# Patient Record
Sex: Male | Born: 1985 | Race: Black or African American | Hispanic: No | Marital: Married | Smoking: Never smoker
Health system: Southern US, Community
[De-identification: ages and names within clinical notes are randomized; demographics above are authoritative.]

---

## 2013-12-04 ENCOUNTER — Emergency Department (INDEPENDENT_AMBULATORY_CARE_PROVIDER_SITE_OTHER)
Admission: EM | Admit: 2013-12-04 | Discharge: 2013-12-04 | Disposition: A | Discharge status: Home / Self Care | Payer: Managed Care, Other (non HMO) | Source: Home / Self Care | Attending: Emergency Medicine | Admitting: Emergency Medicine

## 2013-12-04 ENCOUNTER — Encounter (HOSPITAL_COMMUNITY): Payer: Self-pay | Admitting: Emergency Medicine

## 2013-12-04 DIAGNOSIS — A084 Viral intestinal infection, unspecified: Secondary | ICD-10-CM

## 2013-12-04 DIAGNOSIS — A088 Other specified intestinal infections: Secondary | ICD-10-CM

## 2013-12-04 LAB — POCT I-STAT, CHEM 8
BUN: 12 mg/dL (ref 6–23)
CREATININE: 1.3 mg/dL (ref 0.50–1.35)
Calcium, Ion: 1.2 mmol/L (ref 1.12–1.23)
Chloride: 102 mEq/L (ref 96–112)
Glucose, Bld: 106 mg/dL — ABNORMAL HIGH (ref 70–99)
HEMATOCRIT: 52 % (ref 39.0–52.0)
Hemoglobin: 17.7 g/dL — ABNORMAL HIGH (ref 13.0–17.0)
POTASSIUM: 4.4 meq/L (ref 3.7–5.3)
SODIUM: 139 meq/L (ref 137–147)
TCO2: 25 mmol/L (ref 0–100)

## 2013-12-04 LAB — CBC WITH DIFFERENTIAL/PLATELET
BASOS ABS: 0 10*3/uL (ref 0.0–0.1)
Basophils Relative: 0 % (ref 0–1)
Eosinophils Absolute: 0 10*3/uL (ref 0.0–0.7)
Eosinophils Relative: 0 % (ref 0–5)
HCT: 47.1 % (ref 39.0–52.0)
Hemoglobin: 15.6 g/dL (ref 13.0–17.0)
LYMPHS ABS: 0.5 10*3/uL — AB (ref 0.7–4.0)
Lymphocytes Relative: 5 % — ABNORMAL LOW (ref 12–46)
MCH: 28.8 pg (ref 26.0–34.0)
MCHC: 33.1 g/dL (ref 30.0–36.0)
MCV: 87.1 fL (ref 78.0–100.0)
Monocytes Absolute: 0.4 10*3/uL (ref 0.1–1.0)
Monocytes Relative: 4 % (ref 3–12)
NEUTROS PCT: 91 % — AB (ref 43–77)
Neutro Abs: 10.5 10*3/uL — ABNORMAL HIGH (ref 1.7–7.7)
Platelets: 315 10*3/uL (ref 150–400)
RBC: 5.41 MIL/uL (ref 4.22–5.81)
RDW: 13 % (ref 11.5–15.5)
WBC: 11.5 10*3/uL — AB (ref 4.0–10.5)

## 2013-12-04 MED ORDER — SODIUM CHLORIDE 0.9 % IV SOLN
INTRAVENOUS | Status: DC
  Administered 2013-12-04: 10:00:00 via INTRAVENOUS

## 2013-12-04 MED ORDER — ONDANSETRON 4 MG PO TBDP
8.0000 mg | ORAL_TABLET | Freq: Once | ORAL | Status: AC
  Administered 2013-12-04: 8 mg via ORAL

## 2013-12-04 MED ORDER — DIPHENOXYLATE-ATROPINE 2.5-0.025 MG PO TABS: 1.0000 | ORAL_TABLET | Freq: Four times a day (QID) | ORAL | Status: DC | PRN

## 2013-12-04 MED ORDER — ONDANSETRON 4 MG PO TBDP
ORAL_TABLET | ORAL | Status: AC
  Filled 2013-12-04: qty 1

## 2013-12-04 MED ORDER — ONDANSETRON 8 MG PO TBDP: 8.0000 mg | ORAL_TABLET | Freq: Three times a day (TID) | ORAL | Status: DC | PRN

## 2013-12-04 NOTE — Discharge Instructions (Signed)

## 2013-12-04 NOTE — ED Provider Notes (Signed)
Chief Complaint   Chief Complaint  Patient presents with  . Abdominal Pain     History of Present Illness   Jonathan Contreras is a 28 year old male who has had a history since last night of vomiting about five times. There was no blood in the vomitus, or coffee ground emesis, but the vomitus was somewhat green in color. He also has had diarrhea 9 to 12 times. Stools have been green in color. He tried some Pepto-Bismol but threw this back up. Symptoms came on after eating a meatball sub while at work. He has also had sweats, subjective fever, and chills. He's had pain in his upper abdomen around the epigastrium without radiation to the back or lower abdominal pain. He feels dizzy and weak. He has had no sick exposures or foreign travel.  Review of Systems   Other than as noted above, the patient denies any of the following symptoms: Systemic:  No fevers, chills, or dizziness. ENT:  No nasal congestion, rhinorrhea, or sore throat. Lungs:  No cough. GI:  Blood in stool or vomitus. GU:  No dysuria, frequency, or urgency.  PMFSH   Past medical history, family history, social history, meds, and allergies were reviewed.    Physical Exam     Vital signs:  BP 127/87  Pulse 99  Temp(Src) 98.1 F (36.7 C) (Oral)  Resp 12  SpO2 98% Filed Vitals:   12/04/13 0832 12/04/13 0915 Supine 12/04/13 0916 Sitting 12/04/13 0917 Standing  BP: 149/70 124/88 115/74 127/87  Pulse: 96 60 86 99  Temp: 98.1 F (36.7 C)     TempSrc: Oral     Resp: 12     SpO2: 98%      General:  Alert and oriented.  In no distress.  Skin warm and dry.  Good skin turgor, brisk capillary refill. ENT:  No scleral icterus, moist mucous membranes, no oral lesions, pharynx clear. Lungs:  Breath sounds clear and equal bilaterally.  No wheezes, rales, or rhonchi. Heart:  Rhythm regular, without extrasystoles.  No gallops or murmers. Abdomen:  Abdomen was soft, flat, nondistended. There was no organomegaly or or mass. Bowel  sounds are hyperactive. There is tenderness to palpation in the epigastrium, but also over the entire abdomen without guarding a rebound. Skin: Clear, warm, and dry.  Good turgor.  Brisk capillary refill.  Labs   Results for orders placed during the hospital encounter of 12/04/13  CBC WITH DIFFERENTIAL      Result Value Ref Range   WBC 11.5 (*) 4.0 - 10.5 K/uL   RBC 5.41  4.22 - 5.81 MIL/uL   Hemoglobin 15.6  13.0 - 17.0 g/dL   HCT 81.147.1  91.439.0 - 78.252.0 %   MCV 87.1  78.0 - 100.0 fL   MCH 28.8  26.0 - 34.0 pg   MCHC 33.1  30.0 - 36.0 g/dL   RDW 95.613.0  21.311.5 - 08.615.5 %   Platelets 315  150 - 400 K/uL   Neutrophils Relative % 91 (*) 43 - 77 %   Neutro Abs 10.5 (*) 1.7 - 7.7 K/uL   Lymphocytes Relative 5 (*) 12 - 46 %   Lymphs Abs 0.5 (*) 0.7 - 4.0 K/uL   Monocytes Relative 4  3 - 12 %   Monocytes Absolute 0.4  0.1 - 1.0 K/uL   Eosinophils Relative 0  0 - 5 %   Eosinophils Absolute 0.0  0.0 - 0.7 K/uL   Basophils Relative 0  0 -  1 %   Basophils Absolute 0.0  0.0 - 0.1 K/uL  POCT I-STAT, CHEM 8      Result Value Ref Range   Sodium 139  137 - 147 mEq/L   Potassium 4.4  3.7 - 5.3 mEq/L   Chloride 102  96 - 112 mEq/L   BUN 12  6 - 23 mg/dL   Creatinine, Ser 4.691.30  0.50 - 1.35 mg/dL   Glucose, Bld 629106 (*) 70 - 99 mg/dL   Calcium, Ion 5.281.20  1.12 - 1.23 mmol/L   TCO2 25  0 - 100 mmol/L   Hemoglobin 17.7 (*) 13.0 - 17.0 g/dL   HCT 41.352.0  24.439.0 - 01.052.0 %     Course in Urgent Care Center   He was given Zofran ODT if milligrams sublingually and one leader of IV normal saline. Thereafter he felt a lot better.   Assessment   The encounter diagnosis was Viral gastroenteritis.  With mild dehydration.  Plan   1.  Meds:  The following meds were prescribed:   Discharge Medication List as of 12/04/2013 10:11 AM    START taking these medications   Details  diphenoxylate-atropine (LOMOTIL) 2.5-0.025 MG per tablet Take 1 tablet by mouth 4 (four) times daily as needed for diarrhea or loose  stools., Starting 12/04/2013, Until Discontinued, Print    ondansetron (ZOFRAN ODT) 8 MG disintegrating tablet Take 1 tablet (8 mg total) by mouth every 8 (eight) hours as needed for nausea., Starting 12/04/2013, Until Discontinued, Normal        2.  Patient Education/Counseling:  The patient was given appropriate handouts, self care instructions, and instructed in symptomatic relief. The patient was told to stay on clear liquids for the remainder of the day, then advance to a B.R.A.T. diet starting tomorrow.   3.  Follow up:  The patient was told to follow up here if no better in 2 to 3 days, or sooner if becoming worse in any way, and given some red flag symptoms such as persistent vomitng, high fever, severe abdominal pain, or any GI bleeding which would prompt immediate return.         Reuben Likesavid C Earlisha Sharples, MD 12/04/13 [phone]

## 2013-12-04 NOTE — ED Notes (Signed)
C/o sick since last PM, nausea, vomiting, epigastric pain. No one else at home is ill. NAD

## 2015-05-10 ENCOUNTER — Encounter (HOSPITAL_COMMUNITY): Payer: Self-pay | Admitting: Emergency Medicine

## 2015-05-10 ENCOUNTER — Emergency Department (HOSPITAL_COMMUNITY)
Admission: EM | Admit: 2015-05-10 | Discharge: 2015-05-10 | Disposition: A | Discharge status: Home / Self Care | Payer: Managed Care, Other (non HMO) | Attending: Emergency Medicine | Admitting: Emergency Medicine

## 2015-05-10 ENCOUNTER — Emergency Department (HOSPITAL_COMMUNITY): Payer: Managed Care, Other (non HMO)

## 2015-05-10 DIAGNOSIS — Y998 Other external cause status: Secondary | ICD-10-CM | POA: Insufficient documentation

## 2015-05-10 DIAGNOSIS — Z23 Encounter for immunization: Secondary | ICD-10-CM | POA: Insufficient documentation

## 2015-05-10 DIAGNOSIS — S8001XA Contusion of right knee, initial encounter: Secondary | ICD-10-CM | POA: Insufficient documentation

## 2015-05-10 DIAGNOSIS — S8991XA Unspecified injury of right lower leg, initial encounter: Secondary | ICD-10-CM | POA: Diagnosis present

## 2015-05-10 DIAGNOSIS — W208XXA Other cause of strike by thrown, projected or falling object, initial encounter: Secondary | ICD-10-CM | POA: Insufficient documentation

## 2015-05-10 DIAGNOSIS — S60212A Contusion of left wrist, initial encounter: Secondary | ICD-10-CM | POA: Diagnosis not present

## 2015-05-10 DIAGNOSIS — Y9289 Other specified places as the place of occurrence of the external cause: Secondary | ICD-10-CM | POA: Insufficient documentation

## 2015-05-10 DIAGNOSIS — Y9389 Activity, other specified: Secondary | ICD-10-CM | POA: Diagnosis not present

## 2015-05-10 DIAGNOSIS — T148XXA Other injury of unspecified body region, initial encounter: Secondary | ICD-10-CM

## 2015-05-10 MED ORDER — HYDROCODONE-ACETAMINOPHEN 5-325 MG PO TABS
1.0000 | ORAL_TABLET | Freq: Once | ORAL | Status: AC
  Administered 2015-05-10: 1 via ORAL
  Filled 2015-05-10: qty 1

## 2015-05-10 MED ORDER — HYDROCODONE-ACETAMINOPHEN 5-325 MG PO TABS: ORAL_TABLET | ORAL | Status: DC

## 2015-05-10 MED ORDER — TETANUS-DIPHTH-ACELL PERTUSSIS 5-2.5-18.5 LF-MCG/0.5 IM SUSP
0.5000 mL | Freq: Once | INTRAMUSCULAR | Status: AC
  Administered 2015-05-10: 0.5 mL via INTRAMUSCULAR
  Filled 2015-05-10: qty 0.5

## 2015-05-10 NOTE — ED Provider Notes (Signed)
CSN: [id number]     Arrival date & time 05/10/15  2029 History  By signing my name below, I, Jonathan Contreras, attest that this documentation has been prepared under the direction and in the presence of [employer], PA-C. Electronically Signed: Placido Contreras, ED Scribe. 05/10/2015. 9:12 PM.   Chief Complaint  Patient presents with  . Knee Injury   The history is provided by the patient. No language interpreter was used.    HPI Comments: Jonathan Contreras is a 29 y.o. male who presents to the Emergency Department complaining of constant, moderate, right knee pain with onset this evening. Pt notes that he dropped a barbell on the affected leg with over 400 lbs on the bar. He notes a radiation of his pain to his right thigh, rates the pain as 8/10 and further notes associated numbness to his right thigh. He also notes an associated small superficial abrasion to his left wrist with controlled bleeding. He denies taking any medications for pain management PTA. Pt denies driving himself to the ED. He is unsure of his last tdap. Pt denies any other associated symptoms.   History reviewed. No pertinent past medical history. History reviewed. No pertinent past surgical history. No family history on file. Social History  Substance Use Topics  . Smoking status: Never Smoker   . Smokeless tobacco: None  . Alcohol Use: Yes    Review of Systems A complete 10 system review of systems was obtained and all systems are negative except as noted in the HPI and PMH.   Allergies  Review of patient's allergies indicates no known allergies.  Home Medications   Prior to Admission medications   Medication Sig Start Date End Date Taking? Authorizing Provider  diphenoxylate-atropine (LOMOTIL) 2.5-0.025 MG per tablet Take 1 tablet by mouth 4 (four) times daily as needed for diarrhea or loose stools. 12/04/13   [name], MD  ondansetron (ZOFRAN ODT) 8 MG disintegrating tablet Take 1 tablet (8 mg total)  by mouth every 8 (eight) hours as needed for nausea. 12/04/13   [name], MD   BP 152/79 mmHg  Pulse 76  Temp(Src) 98.3 F (36.8 C) (Oral)  Resp 20  SpO2 100% Physical Exam  Constitutional: He is oriented to person, place, and time. He appears well-developed and well-nourished.  HENT:  Head: Normocephalic and atraumatic.  Mouth/Throat: No oropharyngeal exudate.  Neck: Normal range of motion. No tracheal deviation present.  Cardiovascular: Normal rate.   Pulmonary/Chest: Effort normal. No respiratory distress.  Abdominal: Soft. There is no tenderness.  Musculoskeletal: Normal range of motion. He exhibits edema and tenderness.  Right knee with contusion just proximal to the patella. Patient has full range of motion, he is distally neurovascularly intact, patella is properly located. knee is stable to anterior and posterior drawer. No abnormal laxity on valgus and varus stress.  Distally neurovascularly intact. Patient's left radial forearm on the dorsum has a contusion with a partial thickness abrasion is approximately 2 cm. Radial pulses 2+, there is no snuffbox tenderness. Patient has excellent range of motion to fingers, Refill is brisk, distal sensation is intact to pinprick and light touch.  Neurological: He is alert and oriented to person, place, and time.  Skin: Skin is warm and dry. He is not diaphoretic.  Psychiatric: He has a normal mood and affect. His behavior is normal.  Nursing note and vitals reviewed.  ED Course  Procedures  DIAGNOSTIC STUDIES: Oxygen Saturation is 100% on RA, normal by my  interpretation.    COORDINATION OF CARE: 9:11 PM Discussed treatment plan with pt at bedside including an x-ray of the affected knee, a tdap booster and 1x Vicodin for pain management. Pt agreed to plan.  Labs Review Labs Reviewed - No data to display  Imaging Review Dg Wrist Complete Left  05/10/2015   CLINICAL DATA:  400 pound barbell dropped on patient, with left lateral  wrist and distal forearm pain. Initial encounter.  EXAM: LEFT WRIST - COMPLETE 3+ VIEW  COMPARISON:  None.  FINDINGS: There is no evidence of fracture or dislocation. The carpal rows are intact, and demonstrate normal alignment. The joint spaces are preserved.  No significant soft tissue abnormalities are seen.  IMPRESSION: No evidence of fracture or dislocation.   Electronically Signed   By: [name] M.D.   On: 05/10/2015 21:59   Dg Knee Complete 4 Views Right  05/10/2015   CLINICAL DATA:  Acute right knee pain following barbell dropped on knee. Initial encounter.  EXAM: RIGHT KNEE - COMPLETE 4+ VIEW  COMPARISON:  None.  FINDINGS: There is no evidence of acute fracture, subluxation or dislocation.  Suprapatellar soft tissue swelling is noted.  There is no evidence of joint effusion.  No focal bony lesions are present.  IMPRESSION: Soft tissue swelling without bony abnormality.   Electronically Signed   By: [name] M.D.   On: 05/10/2015 21:59   I have personally reviewed and evaluated these images as part of my medical decision-making.   EKG Interpretation None      MDM   Final diagnoses:  Knee contusion, right, initial encounter  Wrist contusion, left, initial encounter  Abrasion    Filed Vitals:   05/10/15 2044  BP: 152/79  Pulse: 76  Temp: 98.3 F (36.8 C)  TempSrc: Oral  Resp: 20  SpO2: 100%    Medications  Tdap (BOOSTRIX) injection 0.5 mL (0.5 mLs Intramuscular Given 05/10/15 2119)  HYDROcodone-acetaminophen (NORCO/VICODIN) 5-325 MG per tablet 1 tablet (1 tablet Oral Given 05/10/15 2119)    [name] is a pleasant 29 y.o. male presenting with contusion to right knee and left distal forearm. Patient also has a small partial-thickness abrasion. Excellent range of motion and patient is nerouvascularly intact. X-rays are negative. Patient will be given crutches, knee sleeve and advised rest, ice, compression elevation.  Evaluation does not show pathology that  would require ongoing emergent intervention or inpatient treatment. Pt is hemodynamically stable and mentating appropriately. Discussed findings and plan with patient/guardian, who agrees with care plan. All questions answered. Return precautions discussed and outpatient follow up given.   New Prescriptions   HYDROCODONE-ACETAMINOPHEN (NORCO/VICODIN) 5-325 MG TABLET    Take 1-2 tablets by mouth every 6 hours as needed for pain and/or cough.     I personally performed the services described in this documentation, which was scribed in my presence. The recorded information has been reviewed and is accurate.   Wynetta Emery, PA-C 05/10/15 2246  Tilden Fossa, MD 05/11/15 [phone]

## 2015-05-10 NOTE — ED Notes (Signed)
Pt. accidentally dropped a heavy barbell at right knee this evening , reports pain at right knee radiating to right thigh and abrasion at left wrist .

## 2015-05-10 NOTE — Discharge Instructions (Signed)
Rest, Ice intermittently (in the first 24-48 hours), Gentle compression with an Ace wrap, and elevate (Limb above the level of the heart)   Take up to  of ibuprofen (that is usually 4 over the counter pills)  3 times a day for 5 days. Take with food.  Take vicodin for breakthrough pain, do not drink alcohol, drive, care for children or do other critical tasks while taking vicodin.  Do not hesitate to return to the emergency room for any new, worsening or concerning symptoms.  Please obtain primary care using resource guide below. Let them know that you were seen in the emergency room and that they will need to obtain records for further outpatient management.   Contusion A contusion is a deep bruise. Contusions are the result of an injury that caused bleeding under the skin. The contusion may turn blue, purple, or yellow. Minor injuries will give you a painless contusion, but more severe contusions may stay painful and swollen for a few weeks.  CAUSES  A contusion is usually caused by a blow, trauma, or direct force to an area of the body. SYMPTOMS   Swelling and redness of the injured area.  Bruising of the injured area.  Tenderness and soreness of the injured area.  Pain. DIAGNOSIS  The diagnosis can be made by taking a history and physical exam. An X-ray, CT scan, or MRI may be needed to determine if there were any associated injuries, such as fractures. TREATMENT  Specific treatment will depend on what area of the body was injured. In general, the best treatment for a contusion is resting, icing, elevating, and applying cold compresses to the injured area. Over-the-counter medicines may also be recommended for pain control. Ask your caregiver what the best treatment is for your contusion. HOME CARE INSTRUCTIONS   Put ice on the injured area.  Put ice in a plastic bag.  Place a towel between your skin and the bag.  Leave the ice on for 15-20 minutes, 3-4 times a day, or as  directed by your health care provider.  Only take over-the-counter or prescription medicines for pain, discomfort, or fever as directed by your caregiver. Your caregiver may recommend avoiding anti-inflammatory medicines (aspirin, ibuprofen, and naproxen) for 48 hours because these medicines may increase bruising.  Rest the injured area.  If possible, elevate the injured area to reduce swelling. SEEK IMMEDIATE MEDICAL CARE IF:   You have increased bruising or swelling.  You have pain that is getting worse.  Your swelling or pain is not relieved with medicines. MAKE SURE YOU:   Understand these instructions.  Will watch your condition.  Will get help right away if you are not doing well or get worse. Document Released: 05/06/2005 Document Revised: 08/01/2013 Document Reviewed: 06/01/2011 [facility] Patient Information 2015 [place], [place]. This information is not intended to replace advice given to you by your health care provider. Make sure you discuss any questions you have with your health care provider.   Emergency Department Resource Guide 1) Find a Doctor and Pay Out of Pocket Although you won't have to find out who is covered by your insurance plan, it is a good idea to ask around and get recommendations. You will then need to call the office and see if the doctor you have chosen will accept you as a new patient and what types of options they offer for patients who are self-pay. Some doctors offer discounts or will set up payment plans for their patients who  do not have insurance, but you will need to ask so you aren't surprised when you get to your appointment.  2) Contact Your Local Health Department Not all health departments have doctors that can see patients for sick visits, but many do, so it is worth a call to see if yours does. If you don't know where your local health department is, you can check in your phone book. The CDC also has a tool to help you locate your state's  health department, and many state websites also have listings of all of their local health departments.  3) Find a Walk-in Clinic If your illness is not likely to be very severe or complicated, you may want to try a walk in clinic. These are popping up all over the country in pharmacies, drugstores, and shopping centers. They're usually staffed by nurse practitioners or physician assistants that have been trained to treat common illnesses and complaints. They're usually fairly quick and inexpensive. However, if you have serious medical issues or chronic medical problems, these are probably not your best option.  No Primary Care Doctor: - Call Health Connect at  [phone] - they can help you locate a primary care doctor that  accepts your insurance, provides certain services, etc. - Physician Referral Service- [phone]  Chronic Pain Problems: Organization         Address  Phone   Notes  [facility]  [phone] Patients need to be referred by their primary care doctor.   Medication Assistance: Organization         Address  Phone   Notes  [facility] Medication [facility] [address] [place] [place], [place] [address] [phone] --Must be a resident of [facility] -- Must have NO insurance coverage whatsoever (no Medicaid/ Medicare, etc.) -- The pt. MUST have a primary care doctor that directs their care regularly and follows them in the community   [facility]  [phone]   [employer]  [phone]    Agencies that provide inexpensive medical care: Organization         Address  Phone   Notes  [facility]  [phone]   [facility]    [phone]   [facility] [address] [place], [place] [address] [phone]   [facility] [address], [place] [phone]   [facility]anned Parenthood    [phone]   [facility]    [phone]    [facility]  [address], [employer] Phone:  [phone], Fax:  [phone] Hours of Operation:  9 am - 6 pm, M-F.  Also accepts Medicaid/Medicare and self-pay.  [facility]  [address], [place] Phone: [phone], Fax: [phone]. Hours of Operation:  8:30 am - 5:30 pm, M-F.  Also accepts Medicaid and self-pay.  [facility] [place] [address], [place] Phone: [phone]   [facility] [address] Natasha Bence [place], [place] [phone], Ext. [phone] Mondays & Thursdays: 7-9 AM.  First 15 patients are seen on a first come, first serve basis.    Medicaid-accepting [facility] Providers:  Organization         Address  Phone   Notes  [facility] [address], [place] [phone] Also accepts self-pay patients.  [facility]  [address] Laurell Josephs [place], [place]  [phone]   [facility] [address], [place] [phone]   [facility] Medicine [address] [place] [phone]   Renaye Rakers [address], [place]   [phone] Only accepts [place] Access [place] patients after they have their name applied to their card.   Self-Pay (no insurance) in [facility]:  Organization         Address  Phone   Notes  Sickle Cell Patients, [facility] [address] [place], [place] [phone]   [facility] [address] [place], [place] [phone]   [facility]  [address], [place] [phone]   [facility]/Dr. Osei-Bonsu  [address] [place] [address], [place] or [address], [place] [phone] Phone number for both [place] and [place] locations is the same.  [facility] and [facility] [address] [place] [phone]     [facility] [address], [place] or [address] [phone]   [facility] [address] [place] [phone], phone; [phone], fax Sees patients 1st and 3rd Saturday of every month.  Must not qualify for public or private insurance (i.e. Medicaid, Medicare, [place] Health Choice, Veterans' Benefits)  Household income should be no more than 200% of the poverty level The clinic cannot treat you if you are pregnant or think you are pregnant  Sexually transmitted diseases are not treated at the clinic.    Dental Care: Organization         Address  Phone  Notes  [facility] Department of [facility] [address] [place], [place] [phone] Accepts children up to age 41 who are enrolled in [place] or [place] Health Choice; pregnant women with a Medicaid card; and children who have applied for Medicaid or [place] Health Choice, but were declined, whose parents can pay a reduced fee at time of service.  [facility] of [facility]  [address], [place] [phone] Accepts children up to age 80 who are enrolled in [place] or [place] Health Choice; pregnant women with a Medicaid card; and children who have applied for Medicaid or [place] Health Choice, but were declined, whose parents can pay a reduced fee at time of service.  [facility]  [address] [place], [place] [phone] Patients are seen by appointment only. Walk-ins are not accepted. [facility] will see patients 31 years of age and older. Monday - Tuesday (8am-5pm) Most Wednesdays (8:30-5pm) $30 per visit, cash only  [facility]  [address], [facility] [phone] Patients are seen by appointment only. Walk-ins are not accepted. [facility] will see patients 35 years of age and older. One Wednesday Evening (Monthly: Volunteer Based).   $30 per visit, cash only  [employer]  [phone] for adults; Children under age 72, call Graduate Pediatric Dentistry at [phone]. Children aged 82-14, please call [phone] to request a pediatric application.  Dental services are provided in all areas of dental care including fillings, crowns and bridges, complete and partial dentures, implants, gum treatment, root canals, and extractions. Preventive care is also provided. Treatment is provided to both adults and children.  Patients are selected via a lottery and there is often a waiting list.   [facility] [address] [place]  [phone] [email or web address]   [facility] [address] [place], [place] [phone], Ext. [phone] Second and Fourth Thursday of each month, opens at 6:30 AM; Clinic ends at 9 AM.  Patients are seen on a first-come first-served basis, and a limited number are seen during each clinic.   [facility]  [address] Ether Griffins [place], [place] [phone]   Eligibility Requirements You must have lived in [place], [place], or [place] counties for at least the last three months.   You cannot be eligible for state or federal sponsored [place], including [employer], [place], or [employer].   You generally cannot be eligible for healthcare insurance through your employer.    How to apply: Eligibility screenings are held every Tuesday and Wednesday afternoon from 1:00 pm until 4:00 pm. You do not need an appointment for the interview!  [facility] [address], [place], [place] [phone]   [facility] Health Department  [phone]   [facility] Health Department  [phone]   [facility] Health Department  [phone]    Behavioral Health Resources in the Community: Intensive Outpatient Programs Organization         Address  Phone  Notes  [facility] [address] [place], [place] [phone]   [facility] Outpatient [address], [place], [place] [phone]   [facility] [address] [place] [address] [place], [place]  [phone]   [facility] Mental Health [address]  [place], [place] [phone] or [phone]   Substance Abuse Resources Organization         Address  Phone  Notes  [facility]  [phone]   [facility]  [phone]   [facility] [place]  [phone]   Floydene Flock  [phone]   Residential & Outpatient Substance Abuse Program  [phone]   Psychological Services Organization         Address  Phone  Notes  [facility]  [phone]   [facility]  [phone]   [facility] Health [address] [place] [phone] or [phone]    Mobile Crisis Teams Organization         Address  Phone  Notes  [facility], Mobile Crisis Care Unit  [phone]   [facility]  [address] [place], [place] [phone]   Doristine Locks [address] [place] [phone]    Self-Help/Support Groups Organization         Address  Phone             Notes  [facility] [place] - variety of support groups  336- I7437963 Call for more information  Narcotics Anonymous (NA), [facility] [address], [employer] [place]  2 meetings at this location   Statistician         Address  Phone  Notes  [facility] [address],    [place]  [phone]   [facility]  [address], [place] [address], [place], [place] [phone]   [facility] [address] [place], [place] [phone] Admissions: 8am-3pm M-F  [facility] [address]    [place], [place] [phone]   [facility] [phone]  [address] Leonard Schwartz [place], [place] [phone]   [facility] [address],  [place], [place] [phone]   [facility] - Intensive Outpatient [address] Laurell Josephs [address], [place], [place] [phone]   [facility] ([facility]iction [facility] [address] [place]  [place], [place] [phone] or [phone]   [facility] ([facility]) [address], [place], [place] [phone] Accepts Medicaid  [facility] [address]  [place] [phone] Substance Abuse/Addiction Treatment   [facility] Organization         Address  Phone  Notes  [facility]  [phone]   Angie Fava, PhD [address] Ervin Knack [place], [place]   [phone] or [phone]   [facility] Behavioral   [address] [place], [place] [phone]   [facility] [address], [place], [place] [phone] Insurance/Medicaid/sponsorship through [facility] and Families [address]                                    [place], [place] [phone] Therapy/tele-psych/case  [facility] [address] [place], [place] [phone]    Dr. Lolly Mustache  [phone]   [facility]  [employer] [facility] Dept. 1) [address], [place] 2) [address] [place] 3)  [address], [place] [phone]  [phone]   [facility] Child Abuse Hotline (343)538-3502 or [phone] (After Hours)

## 2018-07-27 ENCOUNTER — Ambulatory Visit (INDEPENDENT_AMBULATORY_CARE_PROVIDER_SITE_OTHER): Payer: Self-pay | Admitting: Family Medicine

## 2018-07-29 ENCOUNTER — Encounter (INDEPENDENT_AMBULATORY_CARE_PROVIDER_SITE_OTHER): Payer: Self-pay | Admitting: Family Medicine

## 2018-07-29 ENCOUNTER — Ambulatory Visit (INDEPENDENT_AMBULATORY_CARE_PROVIDER_SITE_OTHER): Payer: Managed Care, Other (non HMO) | Admitting: Family Medicine

## 2018-07-29 ENCOUNTER — Ambulatory Visit (INDEPENDENT_AMBULATORY_CARE_PROVIDER_SITE_OTHER): Payer: Managed Care, Other (non HMO)

## 2018-07-29 DIAGNOSIS — M545 Low back pain, unspecified: Secondary | ICD-10-CM

## 2018-07-29 NOTE — Progress Notes (Signed)
   Office Visit Note   Patient: Jonathan Contreras           Date of Birth: 09-29-1985           MRN: [id number] Visit Date: 07/29/2018 Requested by: No referring provider defined for this encounter. PCP: Jonathan Contreras, Steve, MD  Subjective: Chief Complaint  Patient presents with  . Lower Back - Pain    Pain across lower back x 2 weeks. Powerlifting - felt a pop on the way up. Occasional numbness in buttock region to upper posterior thigh - switches sides.     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  About 2 weeks ago doing dead lift, on the way up he felt something pop in the lower back.  Immediate pain, he stopped his workout and has been resting since then.  He is getting better, but still having pain.  When he sits and bends forward, he feels pain into his left buttocks and hamstring area.  No numbness or tingling in his leg, no bowel or bladder dysfunction, no weakness.  He is never had problems with his back before.  He has a competition in March.  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              ROS: Otherwise noncontributory, he is in excellent health.  Objective: Vital Signs: There were no vitals taken for this visit.  Physical Exam:  Low back: Very muscular male in no acute distress.  Good range of motion of his back, negative stork test.  Straight leg raise is equivocal.  No tenderness over the lumbar spinous processes or SI joints.  The area of pain is lateral to the L4-5 spinous processes but I cannot reproduce his pain by palpation.  No pain in the sciatic notch, lower extremity strength and reflexes are normal.  Imaging: X-rays lumbar spine: Normal anatomic alignment, no obvious fracture.  No congenital deformities.  Assessment & Plan: 1.  Low back pain, possibilities include muscular strain versus lumbar disc protrusion. -Trial of physical therapy.  Relative rest for 2-3 more weeks until no longer having pain down the  back of his left hamstring. -If symptoms persist he will call and I will order MRI lumbar spine.   Follow-Up Instructions: No follow-ups on file.      Procedures: No procedures performed  No notes on file    PMFS History: There are no active problems to display for this patient.  History reviewed. No pertinent past medical history.  History reviewed. No pertinent family history.  History reviewed. No pertinent surgical history. Social History   Occupational History  . Not on file  Tobacco Use  . Smoking status: Never Smoker  Substance and Sexual Activity  . Alcohol use: Yes  . Drug use: No  . Sexual activity: Not on file

## 2018-10-03 ENCOUNTER — Telehealth (INDEPENDENT_AMBULATORY_CARE_PROVIDER_SITE_OTHER): Payer: Self-pay | Admitting: Family Medicine

## 2018-10-03 DIAGNOSIS — M545 Low back pain, unspecified: Secondary | ICD-10-CM

## 2018-10-03 NOTE — Telephone Encounter (Signed)
Please advise 

## 2018-10-03 NOTE — Telephone Encounter (Signed)
Ordered

## 2018-10-03 NOTE — Telephone Encounter (Signed)
Patient called he would like to receive an MRI

## 2018-10-04 NOTE — Telephone Encounter (Signed)
Left a full message on the patient's voice mail - await a call from the MRI facility.

## 2018-10-09 ENCOUNTER — Ambulatory Visit
Admission: RE | Admit: 2018-10-09 | Discharge: 2018-10-09 | Disposition: A | Discharge status: Home / Self Care | Payer: Managed Care, Other (non HMO) | Source: Ambulatory Visit | Attending: Family Medicine | Admitting: Family Medicine

## 2018-10-09 DIAGNOSIS — M545 Low back pain, unspecified: Secondary | ICD-10-CM

## 2018-10-10 ENCOUNTER — Telehealth (INDEPENDENT_AMBULATORY_CARE_PROVIDER_SITE_OTHER): Payer: Self-pay | Admitting: Family Medicine

## 2018-10-10 DIAGNOSIS — N289 Disorder of kidney and ureter, unspecified: Secondary | ICD-10-CM

## 2018-10-10 NOTE — Telephone Encounter (Signed)
MRI shows:  Disc protrusion at L5-S1 with disc contacting the S1 nerves, especially on the right.    Also, there is a spot on the left kidney which is probably benign, but radiologist recommends ultrasound to better evaluate.

## 2018-10-12 NOTE — Telephone Encounter (Signed)
Left full message on the patient's mobile voice mail. He is to give [place] a call back if he has any questions/concerns.

## 2018-10-12 NOTE — Telephone Encounter (Signed)
I would either go another couple times to PT to see if it improves, or else try chiropractic per Dr. Barron Alvine [phone].

## 2018-10-12 NOTE — Telephone Encounter (Signed)
I called and advised the patient of the MRI results. He will await a call on scheduling the [place]. He has been to 2 PT visits at [facility]. Not having much relief as of yet. Continue this or try something else?

## 2018-10-18 ENCOUNTER — Other Ambulatory Visit: Payer: Managed Care, Other (non HMO)

## 2020-04-29 IMAGING — MR MR LUMBAR SPINE W/O CM
5 series · 43 of 48 positions shown · non-contrast
Comparison: Prior radiographs from 07/29/2018.

CLINICAL DATA: Initial evaluation for low back pain radiating into
the left buttock and lower extremity for 2 months.

EXAM:
MRI LUMBAR SPINE WITHOUT CONTRAST
TECHNIQUE: Multiplanar, multisequence MR imaging of the lumbar spine was
performed. No intravenous contrast was administered.

[Series 3: tirm sag · sagittal · 4.0mm · 0.55mm/px · 6 of 13 slices shown]
[im 1/13]
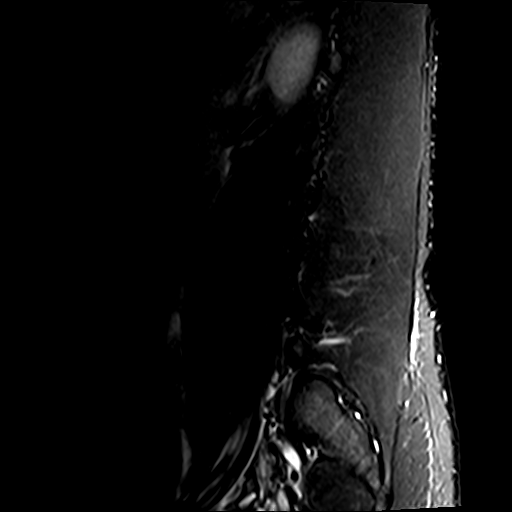
[im 3/13]
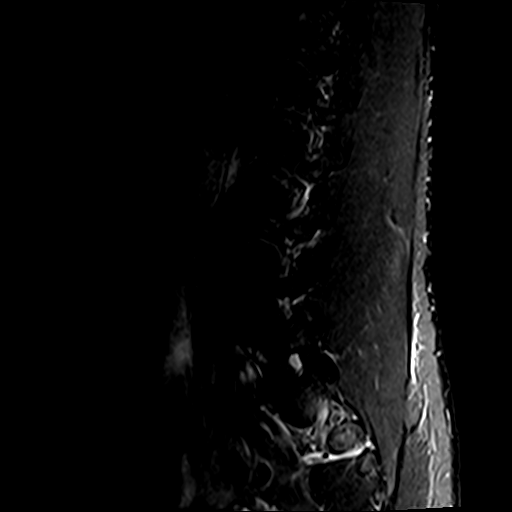
[im 5/13]
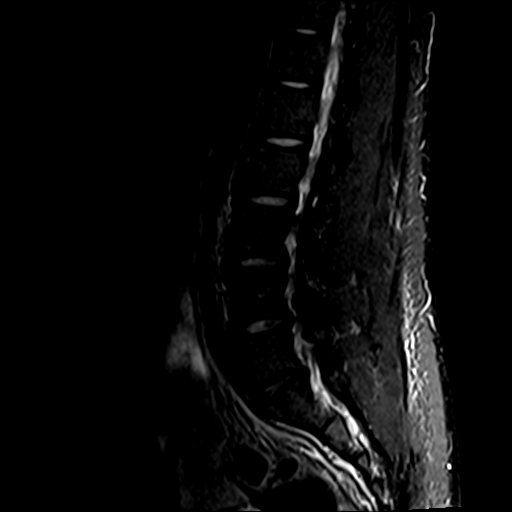
[im 8/13]
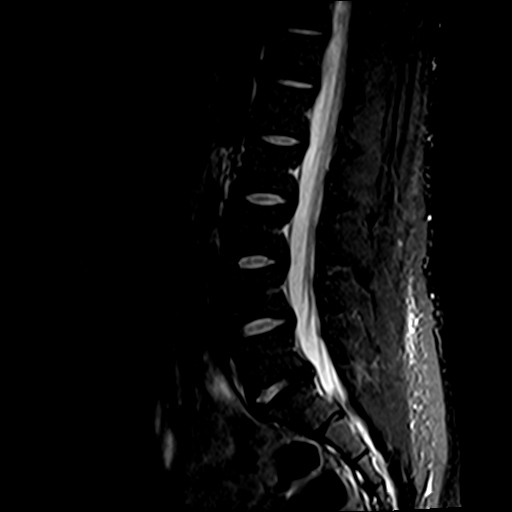
[im 10/13]
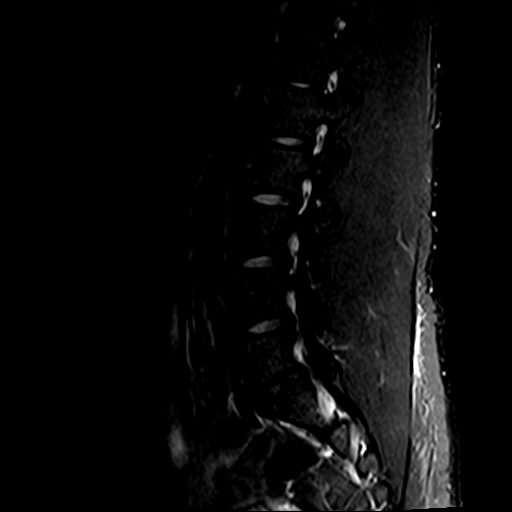
[im 13/13]
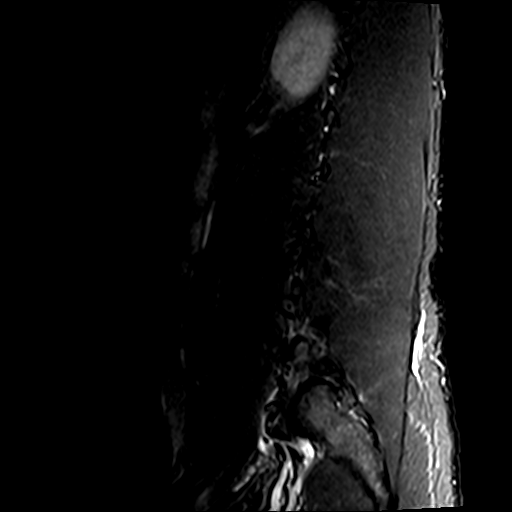

[Series 4: T2 · sagittal · 4.0mm · 0.88mm/px · 5 of 13 slices shown (1 of 2)]
[im 1/13]
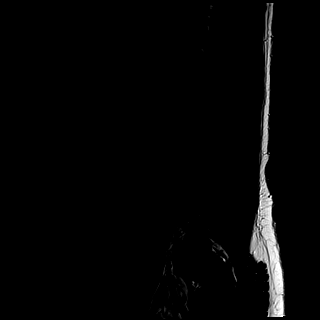
[im 4/13]
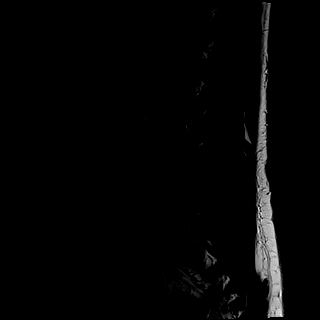
[im 7/13]
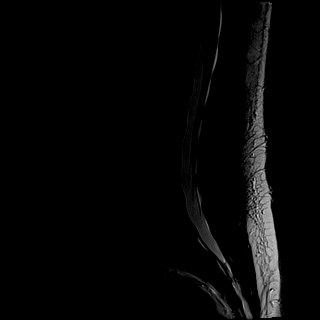
[im 10/13]
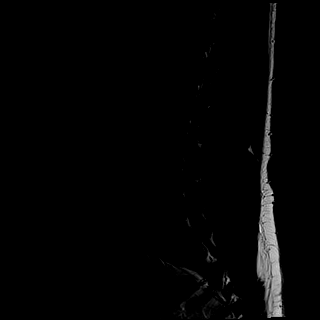
[im 13/13]
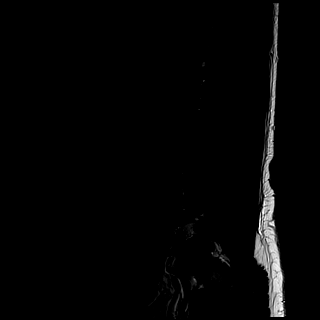

[Series 5: T1 · sagittal · 4.0mm · 0.88mm/px · 5 of 13 slices shown (1 of 2)]
[im 1/13]
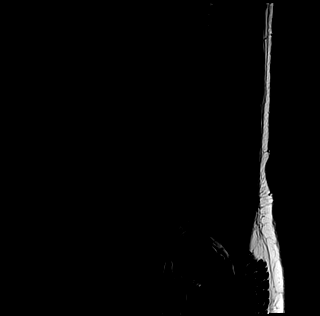
[im 4/13]
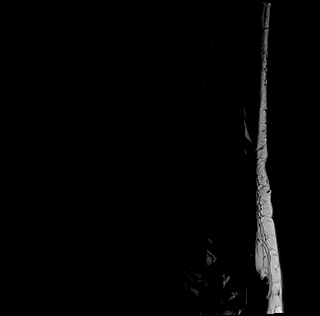
[im 7/13]
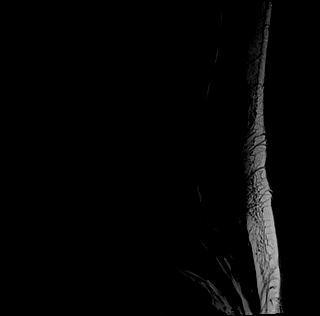
[im 10/13]
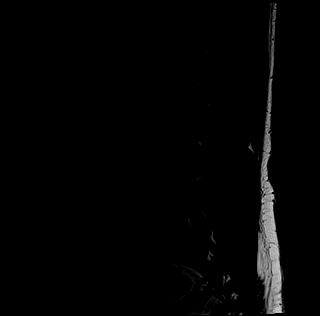
[im 13/13]
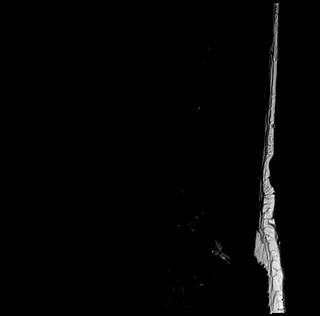

[Series 6: T1 · axial · 4.0mm · 0.78mm/px · z∈[-54,+164]mm · 11 of 39 slices shown (2 of 2)]
[im 1/39]
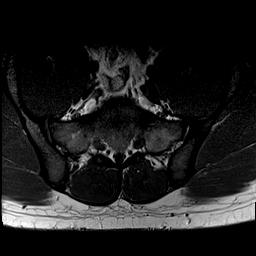
[im 3/39]
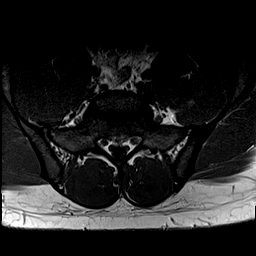
[im 6/39]
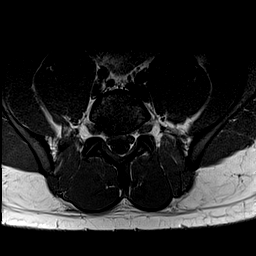
[im 8/39]
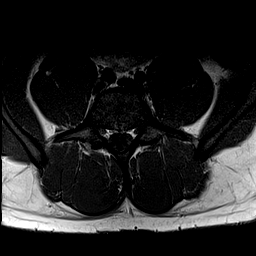
[im 13/39]
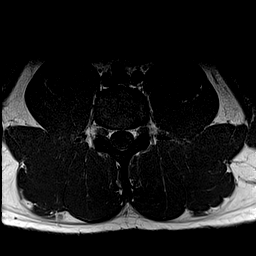
[im 18/39]
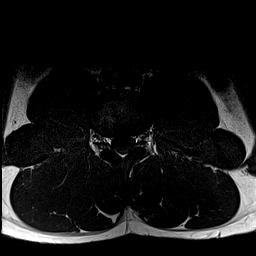
[im 21/39]
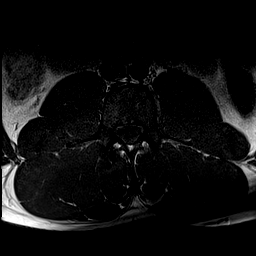
[im 23/39]
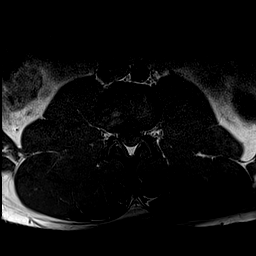
[im 28/39]
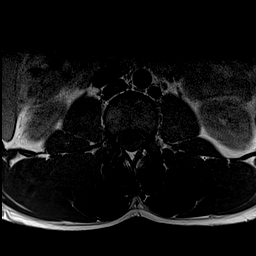
[im 33/39]
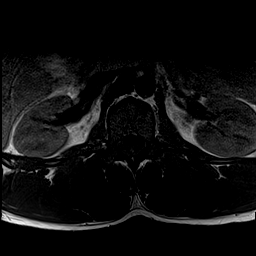
[im 39/39]
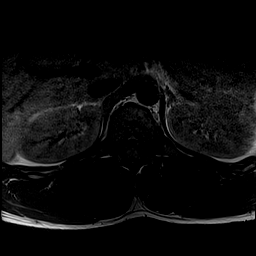

[Series 7: T2 · axial · 4.0mm · 0.78mm/px · z∈[-54,+164]mm · 16 of 39 slices shown (2 of 2)]
[im 1/39]
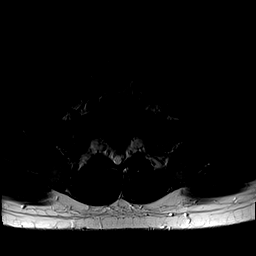
[im 3/39]
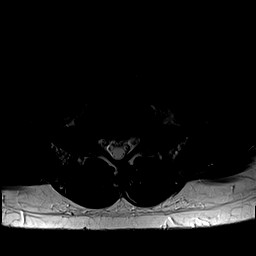
[im 6/39]
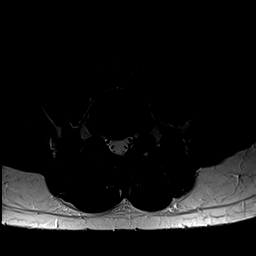
[im 8/39]
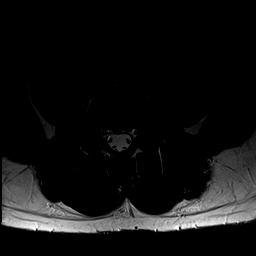
[im 11/39]
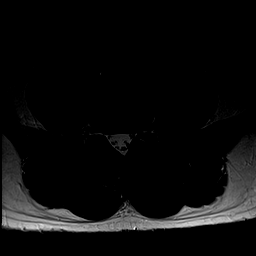
[im 13/39]
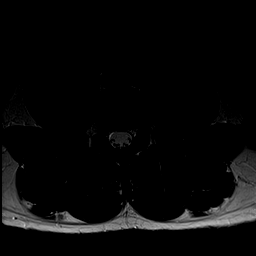
[im 16/39]
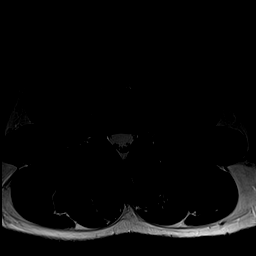
[im 18/39]
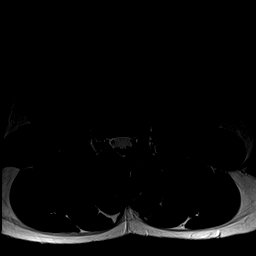
[im 21/39]
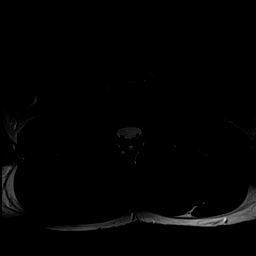
[im 23/39]
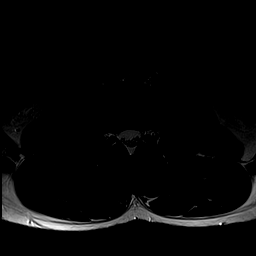
[im 26/39]
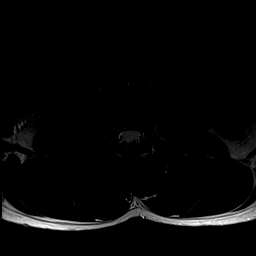
[im 28/39]
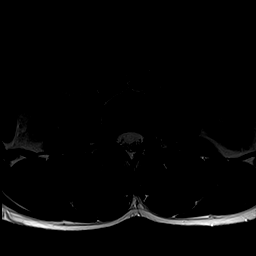
[im 31/39]
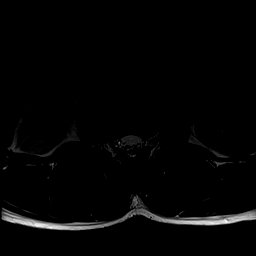
[im 33/39]
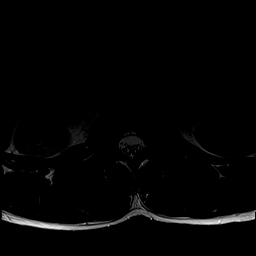
[im 36/39]
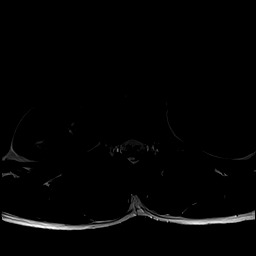
[im 39/39]
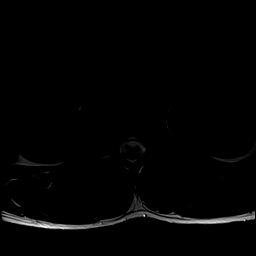

[43 of 48 positions shown; findings below may reference images not displayed]

FINDINGS: Segmentation: Standard. Lowest well-formed disc labeled the L5-S1
level.

Alignment: Vertebral bodies normally aligned with preservation of
the normal lumbar lordosis. No listhesis. Underlying trace
dextroscoliosis noted.

Vertebrae: Vertebral body heights maintained without evidence for
acute or chronic fracture. Bone marrow signal intensity somewhat
diffusely decreased on T1 weighted imaging, most commonly related to
anemia, smoking, or obesity. No discrete or worrisome osseous
lesions. No abnormal marrow edema.

Conus medullaris and cauda equina: Conus extends to the L1 level.
Conus and cauda equina appear normal.

Paraspinal and other soft tissues: Paraspinous soft tissues
demonstrate no acute finding. 9 mm T1 hyperintense lesion present at
the lower pole of the left kidney (series 6, image 14),
indeterminate. Visualized visceral structures otherwise
unremarkable.

Disc levels:

L1-2:  Unremarkable.

L2-3:  Unremarkable.

L3-4:  Unremarkable.

L4-5:  Unremarkable.

L5-S1: Disc bulge with disc desiccation. Broad-based central disc
protrusion mildly indents the ventral thecal sac, contacting the
descending S1 nerve roots as they course through the lateral
recesses (series 6, image 35). The right S1 nerve root is slightly
displaced posteriorly. Mild bilateral lateral recess stenosis.
Central canal remains patent. No significant foraminal encroachment.
IMPRESSION: 1. Broad-based shallow central disc protrusion at L5-S1, contacting
and potentially affecting either of the descending S1 nerve roots as
they course through the lateral recesses.
2. Otherwise normal MRI of the lumbar spine.
3. 9 mm T1 hyperintense lesion at the lower pole of the left kidney,
indeterminate. Follow-up with dedicated renal ultrasound suggested
for further characterization.
